# Patient Record
Sex: Male | Born: 1945 | Race: White | Hispanic: No | Marital: Married | Smoking: Never smoker
Health system: Southern US, Community
[De-identification: ages and names within clinical notes are randomized; demographics above are authoritative.]

## PROBLEM LIST (undated history)

## (undated) DIAGNOSIS — E785 Hyperlipidemia, unspecified: Secondary | ICD-10-CM

## (undated) HISTORY — DX: Hyperlipidemia, unspecified: E78.5

---

## 1998-08-17 ENCOUNTER — Emergency Department (HOSPITAL_COMMUNITY): Admission: EM | Admit: 1998-08-17 | Discharge: 1998-08-17 | Payer: Self-pay | Admitting: Emergency Medicine

## 2004-06-06 ENCOUNTER — Ambulatory Visit (HOSPITAL_COMMUNITY): Admission: RE | Admit: 2004-06-06 | Discharge: 2004-06-06 | Payer: Self-pay | Admitting: Gastroenterology

## 2004-06-06 ENCOUNTER — Encounter (INDEPENDENT_AMBULATORY_CARE_PROVIDER_SITE_OTHER): Payer: Self-pay | Admitting: Specialist

## 2009-07-09 ENCOUNTER — Encounter: Admission: RE | Admit: 2009-07-09 | Discharge: 2009-07-09 | Payer: Self-pay | Admitting: Sports Medicine

## 2009-07-23 ENCOUNTER — Encounter: Admission: RE | Admit: 2009-07-23 | Discharge: 2009-07-23 | Payer: Self-pay | Admitting: Sports Medicine

## 2009-08-22 ENCOUNTER — Encounter: Admission: RE | Admit: 2009-08-22 | Discharge: 2009-08-22 | Payer: Self-pay | Admitting: Sports Medicine

## 2010-02-14 ENCOUNTER — Encounter: Admission: RE | Admit: 2010-02-14 | Discharge: 2010-02-14 | Payer: Self-pay | Admitting: Family Medicine

## 2011-01-16 ENCOUNTER — Other Ambulatory Visit: Payer: Self-pay | Admitting: Family Medicine

## 2011-03-21 ENCOUNTER — Other Ambulatory Visit: Payer: Self-pay | Admitting: Dermatology

## 2011-04-03 ENCOUNTER — Other Ambulatory Visit: Payer: Self-pay | Admitting: Dermatology

## 2011-04-25 NOTE — Op Note (Signed)
NAME:  Russell Cruz, Russell Cruz NO.:  [id number]   MEDICAL RECORD NO.:  [id number]                   PATIENT TYPE:  AMB   LOCATION:  ENDO                                 FACILITY:  [facility]   PHYSICIAN:  Graylin Shiver, M.D.                DATE OF BIRTH:  [birth year]   DATE OF PROCEDURE:  06/06/2004  DATE OF DISCHARGE:                                 OPERATIVE REPORT   PROCEDURE:  Colonoscopy with biopsy.   INDICATION:  Screening.   Informed consent was obtained after explanation of the risks of bleeding,  infection and perforation.   PREMEDICATION:  Fentanyl 87.5 mcg IV, Versed 7 mg IV.   DESCRIPTION OF PROCEDURE:  With the patient in the left lateral decubitus  position rectal exam was performed and no masses were felt.  The Olympus  colonoscope was inserted into the rectum and advanced around the colon to  the cecum.  Cecal landmarks were identified.  The cecum and ascending colon  were normal.  The transverse colon normal.  The descending colon and sigmoid  were normal.  In the rectum there was a small 3-mm polyp biopsied off with  cold forceps.  He tolerated the procedure well without complications.   IMPRESSION:  Small rectal polyp, diagnosis code 211.4.   PLAN:  The pathology will be checked.                                               Graylin Shiver, M.D.    Germain Osgood  D:  06/06/2004  T:  06/06/2004  Job:  [id number]   cc:   Bryan Lemma. Manus Gunning, M.D.  [address] [place]  [place] [address]  Fax: [phone]

## 2012-07-22 ENCOUNTER — Other Ambulatory Visit: Payer: Self-pay | Admitting: Family Medicine

## 2012-07-22 ENCOUNTER — Ambulatory Visit
Admission: RE | Admit: 2012-07-22 | Discharge: 2012-07-22 | Disposition: A | Discharge status: Home / Self Care | Payer: BC Managed Care – PPO | Source: Ambulatory Visit | Attending: Family Medicine | Admitting: Family Medicine

## 2012-07-22 DIAGNOSIS — R05 Cough: Secondary | ICD-10-CM

## 2014-05-02 ENCOUNTER — Ambulatory Visit (INDEPENDENT_AMBULATORY_CARE_PROVIDER_SITE_OTHER): Payer: BC Managed Care – PPO | Admitting: Podiatry

## 2014-05-02 ENCOUNTER — Encounter: Payer: Self-pay | Admitting: Podiatry

## 2014-05-02 ENCOUNTER — Ambulatory Visit (INDEPENDENT_AMBULATORY_CARE_PROVIDER_SITE_OTHER): Payer: BC Managed Care – PPO

## 2014-05-02 VITALS — BP 148/82 | HR 60 | Resp 16 | Ht 72.0 in | Wt 225.0 lb

## 2014-05-02 DIAGNOSIS — M722 Plantar fascial fibromatosis: Secondary | ICD-10-CM

## 2014-05-02 MED ORDER — METHYLPREDNISOLONE (PAK) 4 MG PO TABS: ORAL_TABLET | ORAL | Status: DC

## 2014-05-02 MED ORDER — MELOXICAM 15 MG PO TABS: 15.0000 mg | ORAL_TABLET | Freq: Every day | ORAL | Status: DC

## 2014-05-02 NOTE — Progress Notes (Signed)
   Subjective:    Patient ID: Russell Cruz, male    DOB: 07-01-1946, 68 y.o.   MRN: [id number]  HPI Comments: "My right heel hurts"  Patient c/o aching plantar heel right for 3-4 months. He does have AM pain. Noticed that his calf felt tight the other day. Worse the more walking he does. He wears OTC inserts, padding at the heel, change in shoes-no better.     Review of Systems  All other systems reviewed and are negative.      Objective:   Physical Exam: I have reviewed his past medical history medications allergies surgeries social history and review of systems. Pulses are palpable bilateral. Neurologic sensorium is intact per Semmes-Weinstein monofilament. Deep tendon reflexes are intact bilateral. Muscle strength is 5 over 5 dorsiflexors plantar flexors inverters everters all intrinsic musculature is intact. Orthopedic evaluation demonstrates pain on palpation medial calcaneal tubercle of the right heel. Radiographic evaluation demonstrates a soft tissue increase in density at the plantar fascial calcaneal insertion site. Cutaneous evaluation demonstrates supple well hydrated cutis no erythema edema saline is drainage or odor noted.        Assessment & Plan:  Assessment: Plantar fasciitis right heel. Chronic.  Plan: Injected the right heel today and put him in a plantar fascial brace as well as a night splint. Wrote her prescription for Medrol Dosepak to be followed by medic. We discussed appropriate shoe gear stretching exercises ice therapy and shoe gear modifications. I will followup with him in one month.

## 2014-05-02 NOTE — Patient Instructions (Signed)

## 2016-01-11 ENCOUNTER — Other Ambulatory Visit: Payer: Self-pay | Admitting: Surgery

## 2016-08-21 ENCOUNTER — Ambulatory Visit
Admission: RE | Admit: 2016-08-21 | Discharge: 2016-08-21 | Disposition: A | Discharge status: Home / Self Care | Payer: BLUE CROSS/BLUE SHIELD | Source: Ambulatory Visit | Attending: Family Medicine | Admitting: Family Medicine

## 2016-08-21 ENCOUNTER — Other Ambulatory Visit: Payer: Self-pay | Admitting: Family Medicine

## 2016-08-21 DIAGNOSIS — Z01818 Encounter for other preprocedural examination: Secondary | ICD-10-CM

## 2017-09-21 ENCOUNTER — Other Ambulatory Visit: Payer: Self-pay | Admitting: Family Medicine

## 2018-04-01 IMAGING — CR DG CHEST 2V
2 series · 2 of 2 positions shown · non-contrast
Comparison: Radiographs July 22, 2012.

CLINICAL DATA: Preop for left shoulder surgery.

EXAM:
CHEST  2 VIEW

[w chest pa]
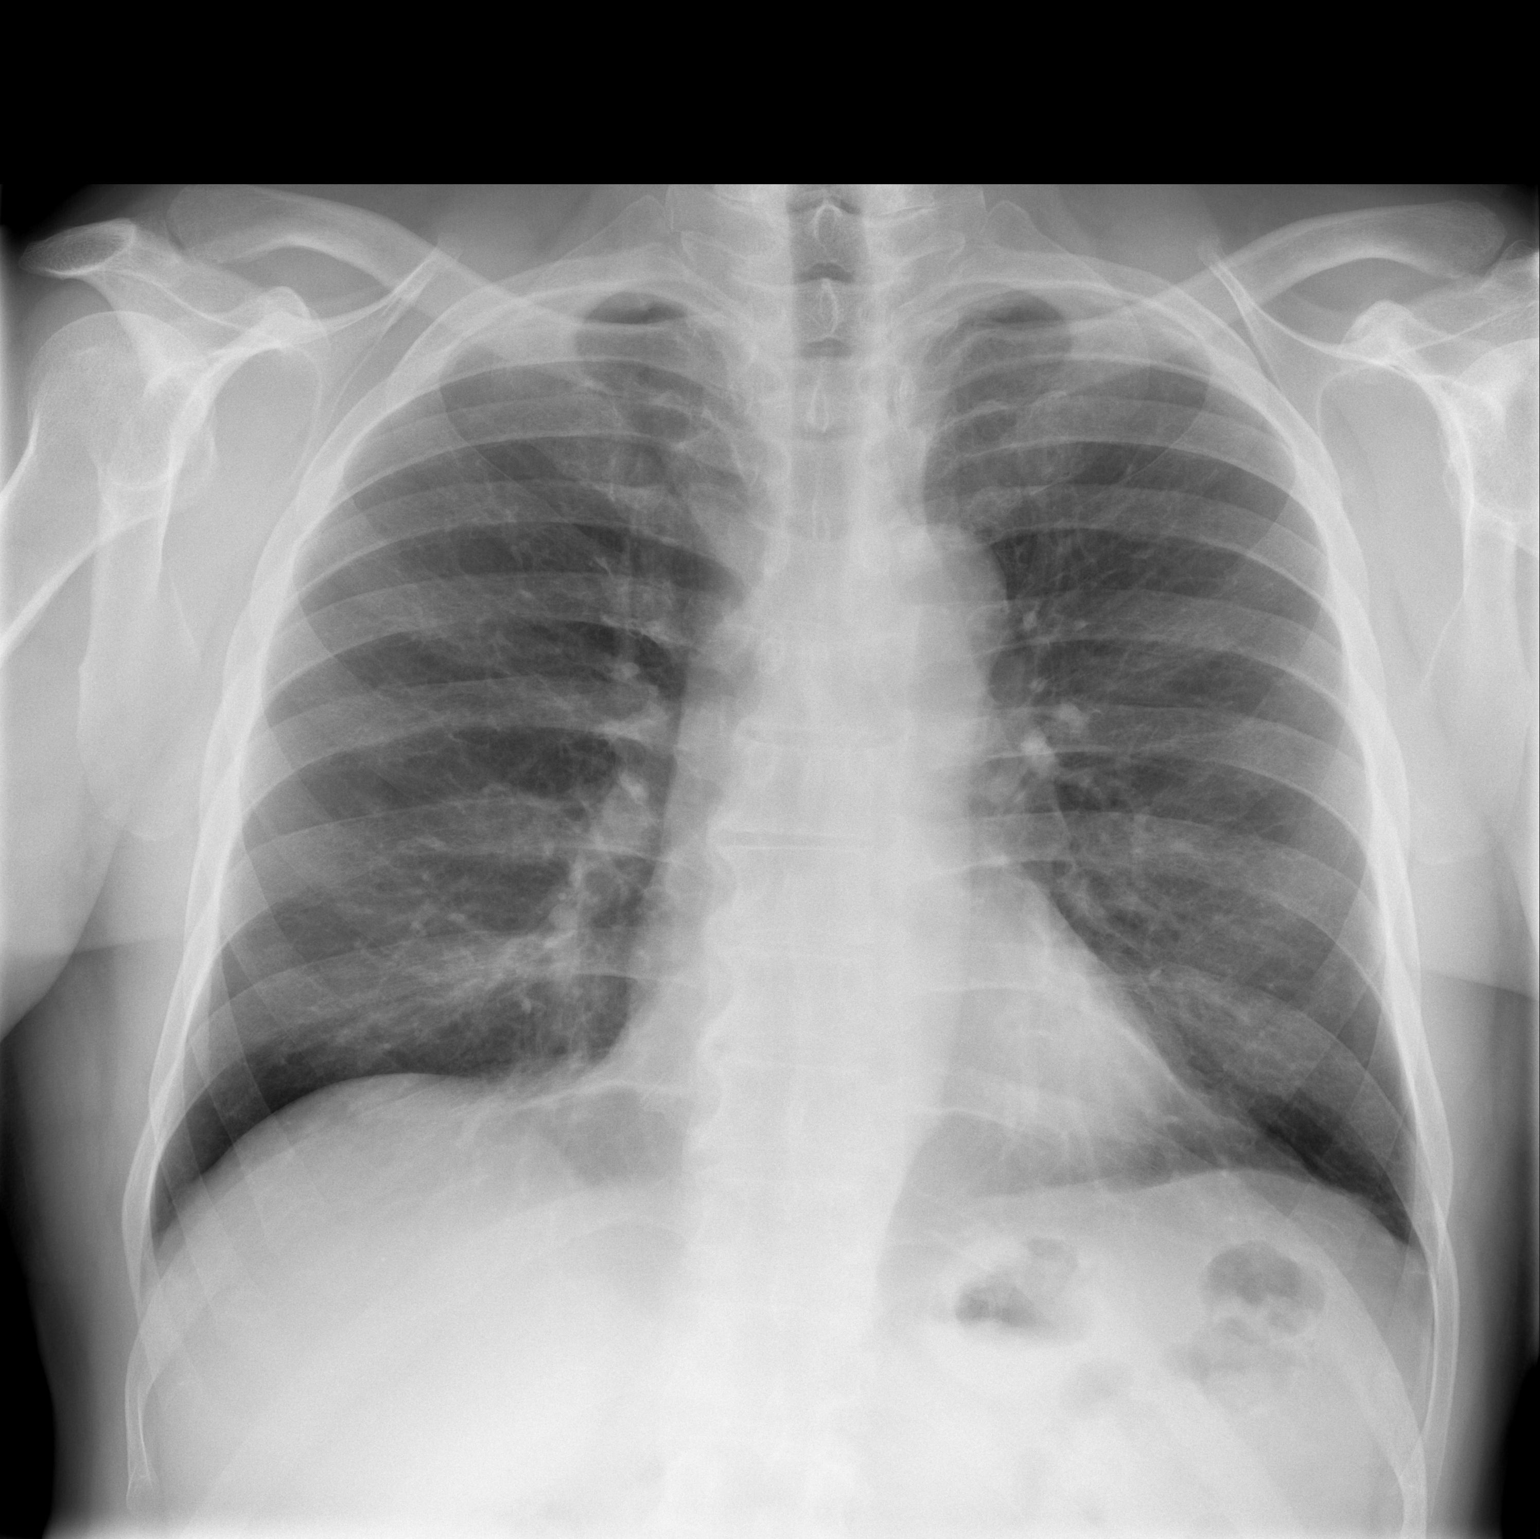

[w chest lat]
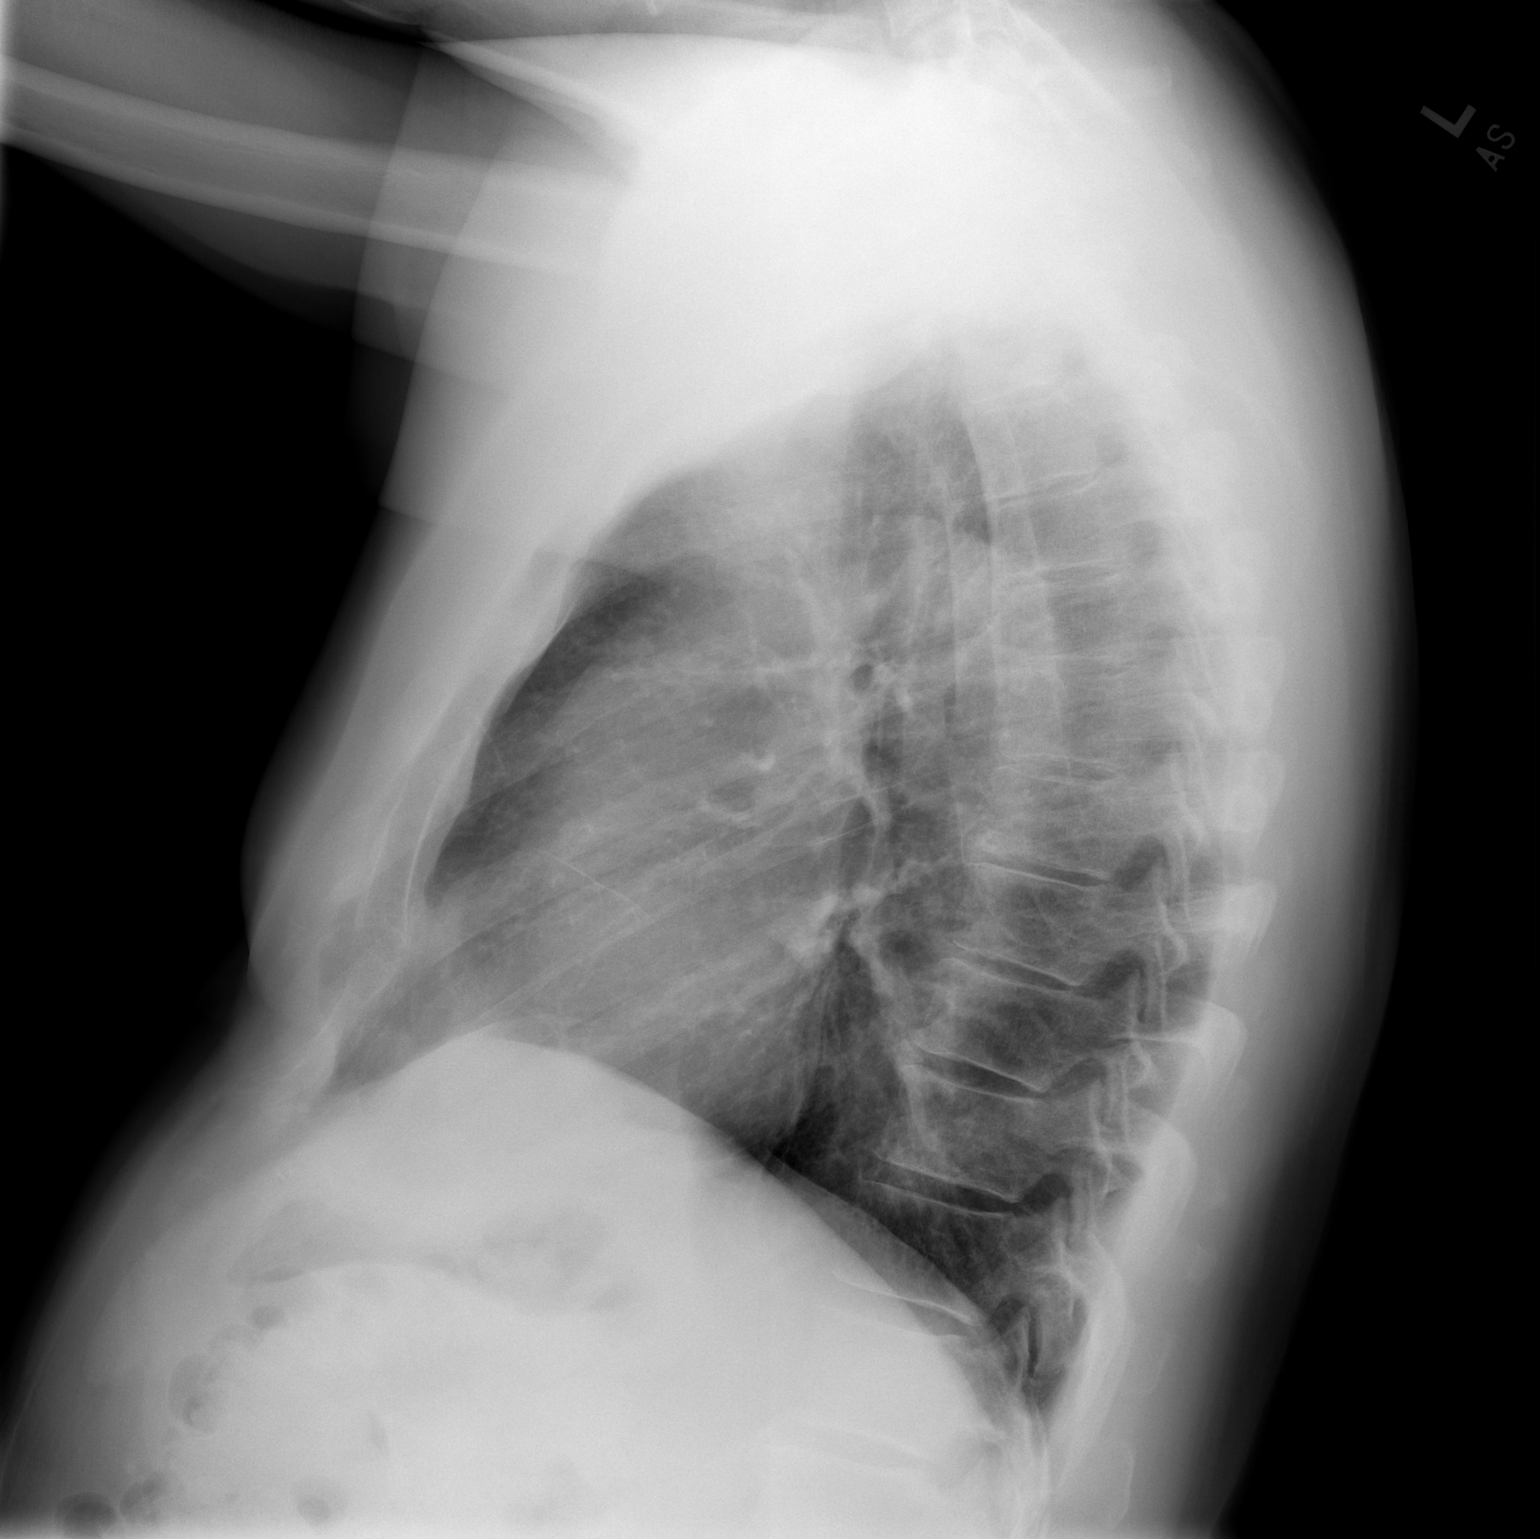

[2 of 2 positions shown; findings below may reference images not displayed]

FINDINGS: The heart size and mediastinal contours are within normal limits.
Both lungs are clear. The visualized skeletal structures are
unremarkable.
IMPRESSION: No active cardiopulmonary disease.

## 2018-09-27 ENCOUNTER — Encounter (HOSPITAL_COMMUNITY): Payer: Self-pay | Admitting: Emergency Medicine

## 2018-09-27 ENCOUNTER — Ambulatory Visit (HOSPITAL_COMMUNITY)
Admission: EM | Admit: 2018-09-27 | Discharge: 2018-09-27 | Disposition: A | Discharge status: Home / Self Care | Payer: BLUE CROSS/BLUE SHIELD | Attending: Family Medicine | Admitting: Family Medicine

## 2018-09-27 DIAGNOSIS — S61210A Laceration without foreign body of right index finger without damage to nail, initial encounter: Secondary | ICD-10-CM

## 2018-09-27 MED ORDER — LIDOCAINE HCL 2 % IJ SOLN
INTRAMUSCULAR | Status: AC
  Filled 2018-09-27: qty 20

## 2018-09-27 MED ORDER — POVIDONE-IODINE 10 % EX SOLN
CUTANEOUS | Status: AC
  Filled 2018-09-27: qty 118

## 2018-09-27 NOTE — Discharge Instructions (Signed)
Sutures out in 14 days Keep clean and reasonably dry Watch for infection

## 2018-09-27 NOTE — ED Provider Notes (Signed)
[place]    CSN: [id number] Arrival date & time: 09/27/18  1802     History   Chief Complaint Chief Complaint  Patient presents with  . Laceration    HPI Russell Cruz is a 72 y.o. male.   HPI  Cut finger with pocket knife Bleeding controlled with pressure Last tetanus 06-2011 Normal movement and feeling  Past Medical History:  Diagnosis Date  . Hyperlipidemia     There are no active problems to display for this patient.   History reviewed. No pertinent surgical history.     Home Medications    Prior to Admission medications   Medication Sig Start Date End Date Taking? Authorizing Provider  CALCIUM CITRATE PO Take by mouth.    [provider]  co-enzyme Q-10 30 MG capsule Take 30 mg by mouth 3 (three) times daily.    [provider]  Cyanocobalamin (B-12 PO) Take by mouth.    [provider]  glucosamine-chondroitin 500-400 MG tablet Take 1 tablet by mouth 2 (two) times daily.    [provider]  meloxicam (MOBIC) 15 MG tablet Take 1 tablet (15 mg total) by mouth daily. 05/02/14   Hyatt, Max T, DPM  Misc Natural Products (NF FORMULAS TESTOSTERONE PO) Take by mouth.    [provider]  Multiple Vitamin (MULTIVITAMIN WITH MINERALS) TABS tablet Take 1 tablet by mouth daily.    [provider]  Omega-3 Fatty Acids (FISH OIL PO) Take by mouth.    [provider]  rosuvastatin (CRESTOR) 20 MG tablet Take 20 mg by mouth daily.    [provider]    Family History No family history on file. Patient says no cancer.  Hypertension   Son died of massive MI at age 65 Social History Social History   Tobacco Use  . Smoking status: Never Smoker  Substance Use Topics  . Alcohol use: Yes  . Drug use: Not on file     Allergies   Patient has no known allergies.   Review of Systems Review of Systems  Constitutional: Negative for chills and fever.  HENT: Negative for ear pain and  sore throat.   Eyes: Negative for pain and visual disturbance.  Respiratory: Negative for cough and shortness of breath.   Cardiovascular: Negative for chest pain and palpitations.  Gastrointestinal: Negative for abdominal pain and vomiting.  Genitourinary: Negative for dysuria and hematuria.  Musculoskeletal: Negative for arthralgias and back pain.  Skin: Positive for wound. Negative for color change and rash.  Neurological: Negative for seizures and syncope.  All other systems reviewed and are negative.    Physical Exam Triage Vital Signs ED Triage Vitals  Enc Vitals Group     BP 09/27/18 1820 (!) 147/86     Pulse Rate 09/27/18 1820 77     Resp 09/27/18 1820 18     Temp 09/27/18 1820 98.1 F (36.7 C)     Temp src --      SpO2 09/27/18 1820 94 %     Weight --      Height --      Head Circumference --      Peak Flow --      Pain Score 09/27/18 1821 2     Pain Loc --      Pain Edu? --      Excl. in [place]? --    No data found.  Updated Vital Signs BP (!) 147/86   Pulse 77  Temp 98.1 F (36.7 C)   Resp 18   SpO2 94%       Physical Exam  Constitutional: He appears well-developed and well-nourished. No distress.  HENT:  Head: Normocephalic and atraumatic.  Mouth/Throat: Oropharynx is clear and moist.  Eyes: Pupils are equal, round, and reactive to light. Conjunctivae are normal.  Neck: Normal range of motion.  Cardiovascular: Normal rate.  Pulmonary/Chest: Effort normal. No respiratory distress.  Abdominal: Soft. He exhibits no distension.  Musculoskeletal: Normal range of motion. He exhibits no edema.  Neurological: He is alert. No sensory deficit.  Skin: Skin is warm and dry.  Straight laceration on dorsum of finger right index from mid prox phalanx to DIP crease.  Psychiatric: He has a normal mood and affect. His behavior is normal.     UC Treatments / Results  Labs (all labs ordered are listed, but only abnormal results are displayed) Labs Reviewed - No  data to display  EKG None  Radiology No results found.  Procedures Laceration Repair Date/Time: 09/27/2018 7:38 PM Performed by: Raylene Everts, MD Authorized by: Raylene Everts, MD   Consent:    Consent obtained:  Verbal   Consent given by:  Patient and spouse   Risks discussed:  Infection, need for additional repair, tendon damage, vascular damage and nerve damage   Alternatives discussed:  No treatment and referral Anesthesia (see MAR for exact dosages):    Anesthesia method:  Nerve block   Block location:  Index finger r   Block needle gauge:  27 G   Block anesthetic:  Lidocaine 2% w/o epi   Block injection procedure:  Anatomic landmarks identified, introduced needle and negative aspiration for blood   Block outcome:  Anesthesia achieved Laceration details:    Location:  Finger   Finger location:  R index finger   Length (cm):  7   Depth (mm):  5 Repair type:    Repair type:  Simple Pre-procedure details:    Preparation:  Patient was prepped and draped in usual sterile fashion Exploration:    Hemostasis achieved with:  Direct pressure   Wound exploration: wound explored through full range of motion     Wound extent: no foreign bodies/material noted, no muscle damage noted, no nerve damage noted, no tendon damage noted and no vascular damage noted     Contaminated: no   Treatment:    Area cleansed with:  Betadine   Amount of cleaning:  Extensive   Irrigation solution:  Sterile saline   Irrigation volume:  20 cc   Irrigation method:  Syringe Skin repair:    Repair method:  Sutures   Suture size:  4-0   Suture material:  Prolene   Suture technique:  Simple interrupted   Number of sutures:  7 Approximation:    Approximation:  Close Post-procedure details:    Dressing:  Non-adherent dressing and splint for protection   Patient tolerance of procedure:  Tolerated well, no immediate complications   (including critical care time)  Medications Ordered in  UC Medications - No data to display  Initial Impression / Assessment and Plan / UC Course  I have reviewed the triage vital signs and the nursing notes.  Pertinent labs & imaging results that were available during my care of the patient were reviewed by me and considered in my medical decision making (see chart for details).     Discussed cleaning.  Wound care.  Splint.  Activity limits.  infection. Final Clinical Impressions(s) /  UC Diagnoses   Final diagnoses:  Laceration of right index finger without foreign body without damage to nail, initial encounter     Discharge Instructions     Sutures out in 14 days Keep clean and reasonably dry Watch for infection   ED Prescriptions    None     Controlled Substance Prescriptions [place] Controlled Substance Registry consulted? Not Applicable   Raylene Everts, MD 09/27/18 [phone]

## 2018-09-27 NOTE — ED Triage Notes (Signed)
Pt states he was trying to poke a hole in his belt and closed his R index finger in the pocket knife. Large laceration noted to R index finger. Bleeding controlled.

## 2018-11-02 ENCOUNTER — Ambulatory Visit (HOSPITAL_COMMUNITY)
Admission: RE | Admit: 2018-11-02 | Discharge: 2018-11-02 | Disposition: A | Discharge status: Home / Self Care | Payer: BLUE CROSS/BLUE SHIELD | Source: Ambulatory Visit | Attending: Orthopedic Surgery | Admitting: Orthopedic Surgery

## 2018-11-02 ENCOUNTER — Other Ambulatory Visit (HOSPITAL_COMMUNITY): Payer: Self-pay | Admitting: Orthopedic Surgery

## 2018-11-02 DIAGNOSIS — M79604 Pain in right leg: Secondary | ICD-10-CM | POA: Insufficient documentation

## 2018-11-02 DIAGNOSIS — M7989 Other specified soft tissue disorders: Principal | ICD-10-CM

## 2018-11-02 NOTE — Progress Notes (Signed)
*  Preliminary Results* Right lower extremity venous duplex completed. 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 No other DVT noted. There is no evidence of right Baker's cyst.  Attempted to call report, left message on [phone] and will let patient go home.  11/02/2018 3:52 PM  Maudry Mayhew, MHA, RVT, RDCS, RDMS

## 2018-11-03 ENCOUNTER — Ambulatory Visit (HOSPITAL_COMMUNITY): Payer: BLUE CROSS/BLUE SHIELD

## 2020-05-18 ENCOUNTER — Other Ambulatory Visit: Payer: Self-pay | Admitting: Family Medicine

## 2020-05-18 DIAGNOSIS — C44622 Squamous cell carcinoma of skin of right upper limb, including shoulder: Secondary | ICD-10-CM | POA: Diagnosis not present

## 2020-05-18 DIAGNOSIS — Z79899 Other long term (current) drug therapy: Secondary | ICD-10-CM | POA: Diagnosis not present

## 2020-05-18 DIAGNOSIS — D485 Neoplasm of uncertain behavior of skin: Secondary | ICD-10-CM | POA: Diagnosis not present

## 2020-05-18 DIAGNOSIS — E7800 Pure hypercholesterolemia, unspecified: Secondary | ICD-10-CM | POA: Diagnosis not present

## 2020-06-08 DIAGNOSIS — M25512 Pain in left shoulder: Secondary | ICD-10-CM | POA: Diagnosis not present

## 2020-06-14 DIAGNOSIS — Z85828 Personal history of other malignant neoplasm of skin: Secondary | ICD-10-CM | POA: Diagnosis not present

## 2020-06-14 DIAGNOSIS — L821 Other seborrheic keratosis: Secondary | ICD-10-CM | POA: Diagnosis not present

## 2020-06-14 DIAGNOSIS — L820 Inflamed seborrheic keratosis: Secondary | ICD-10-CM | POA: Diagnosis not present

## 2020-06-14 DIAGNOSIS — I8391 Asymptomatic varicose veins of right lower extremity: Secondary | ICD-10-CM | POA: Diagnosis not present

## 2020-08-06 DIAGNOSIS — H6091 Unspecified otitis externa, right ear: Secondary | ICD-10-CM | POA: Diagnosis not present

## 2020-10-30 DIAGNOSIS — Z86010 Personal history of colonic polyps: Secondary | ICD-10-CM | POA: Diagnosis not present

## 2020-10-30 DIAGNOSIS — R7303 Prediabetes: Secondary | ICD-10-CM | POA: Diagnosis not present

## 2020-10-30 DIAGNOSIS — Z0000 Encounter for general adult medical examination without abnormal findings: Secondary | ICD-10-CM | POA: Diagnosis not present

## 2020-10-30 DIAGNOSIS — E7800 Pure hypercholesterolemia, unspecified: Secondary | ICD-10-CM | POA: Diagnosis not present

## 2020-10-30 DIAGNOSIS — Z79899 Other long term (current) drug therapy: Secondary | ICD-10-CM | POA: Diagnosis not present

## 2020-12-31 DIAGNOSIS — L905 Scar conditions and fibrosis of skin: Secondary | ICD-10-CM | POA: Diagnosis not present

## 2020-12-31 DIAGNOSIS — L821 Other seborrheic keratosis: Secondary | ICD-10-CM | POA: Diagnosis not present

## 2020-12-31 DIAGNOSIS — L814 Other melanin hyperpigmentation: Secondary | ICD-10-CM | POA: Diagnosis not present

## 2020-12-31 DIAGNOSIS — Z85828 Personal history of other malignant neoplasm of skin: Secondary | ICD-10-CM | POA: Diagnosis not present

## 2020-12-31 DIAGNOSIS — L570 Actinic keratosis: Secondary | ICD-10-CM | POA: Diagnosis not present

## 2021-01-16 DIAGNOSIS — Z1159 Encounter for screening for other viral diseases: Secondary | ICD-10-CM | POA: Diagnosis not present

## 2021-01-16 DIAGNOSIS — Z86010 Personal history of colonic polyps: Secondary | ICD-10-CM | POA: Diagnosis not present

## 2021-01-16 DIAGNOSIS — K5900 Constipation, unspecified: Secondary | ICD-10-CM | POA: Diagnosis not present

## 2021-01-31 DIAGNOSIS — Z79899 Other long term (current) drug therapy: Secondary | ICD-10-CM | POA: Diagnosis not present

## 2021-01-31 DIAGNOSIS — E7800 Pure hypercholesterolemia, unspecified: Secondary | ICD-10-CM | POA: Diagnosis not present

## 2021-02-27 DIAGNOSIS — C44629 Squamous cell carcinoma of skin of left upper limb, including shoulder: Secondary | ICD-10-CM | POA: Diagnosis not present

## 2021-02-27 DIAGNOSIS — D485 Neoplasm of uncertain behavior of skin: Secondary | ICD-10-CM | POA: Diagnosis not present

## 2021-03-13 DIAGNOSIS — D485 Neoplasm of uncertain behavior of skin: Secondary | ICD-10-CM | POA: Diagnosis not present

## 2021-03-13 DIAGNOSIS — C44629 Squamous cell carcinoma of skin of left upper limb, including shoulder: Secondary | ICD-10-CM | POA: Diagnosis not present

## 2021-03-13 DIAGNOSIS — Z85828 Personal history of other malignant neoplasm of skin: Secondary | ICD-10-CM | POA: Diagnosis not present

## 2021-04-02 DIAGNOSIS — K5730 Diverticulosis of large intestine without perforation or abscess without bleeding: Secondary | ICD-10-CM | POA: Diagnosis not present

## 2021-04-02 DIAGNOSIS — D124 Benign neoplasm of descending colon: Secondary | ICD-10-CM | POA: Diagnosis not present

## 2021-04-02 DIAGNOSIS — Z86010 Personal history of colonic polyps: Secondary | ICD-10-CM | POA: Diagnosis not present

## 2021-04-05 DIAGNOSIS — D124 Benign neoplasm of descending colon: Secondary | ICD-10-CM | POA: Diagnosis not present

## 2021-05-08 DIAGNOSIS — E7800 Pure hypercholesterolemia, unspecified: Secondary | ICD-10-CM | POA: Diagnosis not present

## 2021-11-12 DIAGNOSIS — Z79899 Other long term (current) drug therapy: Secondary | ICD-10-CM | POA: Diagnosis not present

## 2021-11-12 DIAGNOSIS — D692 Other nonthrombocytopenic purpura: Secondary | ICD-10-CM | POA: Diagnosis not present

## 2021-11-12 DIAGNOSIS — R7303 Prediabetes: Secondary | ICD-10-CM | POA: Diagnosis not present

## 2021-11-12 DIAGNOSIS — Z0000 Encounter for general adult medical examination without abnormal findings: Secondary | ICD-10-CM | POA: Diagnosis not present

## 2021-11-12 DIAGNOSIS — E7800 Pure hypercholesterolemia, unspecified: Secondary | ICD-10-CM | POA: Diagnosis not present

## 2021-12-06 DIAGNOSIS — J019 Acute sinusitis, unspecified: Secondary | ICD-10-CM | POA: Diagnosis not present

## 2022-01-01 DIAGNOSIS — R059 Cough, unspecified: Secondary | ICD-10-CM | POA: Diagnosis not present

## 2022-01-01 DIAGNOSIS — J988 Other specified respiratory disorders: Secondary | ICD-10-CM | POA: Diagnosis not present

## 2022-01-01 DIAGNOSIS — J069 Acute upper respiratory infection, unspecified: Secondary | ICD-10-CM | POA: Diagnosis not present

## 2022-01-02 DIAGNOSIS — D0439 Carcinoma in situ of skin of other parts of face: Secondary | ICD-10-CM | POA: Diagnosis not present

## 2022-01-02 DIAGNOSIS — D1801 Hemangioma of skin and subcutaneous tissue: Secondary | ICD-10-CM | POA: Diagnosis not present

## 2022-01-02 DIAGNOSIS — Z85828 Personal history of other malignant neoplasm of skin: Secondary | ICD-10-CM | POA: Diagnosis not present

## 2022-01-02 DIAGNOSIS — L814 Other melanin hyperpigmentation: Secondary | ICD-10-CM | POA: Diagnosis not present

## 2022-01-02 DIAGNOSIS — D485 Neoplasm of uncertain behavior of skin: Secondary | ICD-10-CM | POA: Diagnosis not present

## 2022-01-02 DIAGNOSIS — L570 Actinic keratosis: Secondary | ICD-10-CM | POA: Diagnosis not present

## 2022-01-02 DIAGNOSIS — L821 Other seborrheic keratosis: Secondary | ICD-10-CM | POA: Diagnosis not present

## 2022-04-07 DIAGNOSIS — L578 Other skin changes due to chronic exposure to nonionizing radiation: Secondary | ICD-10-CM | POA: Diagnosis not present

## 2022-04-07 DIAGNOSIS — D0439 Carcinoma in situ of skin of other parts of face: Secondary | ICD-10-CM | POA: Diagnosis not present

## 2022-04-07 DIAGNOSIS — Z85828 Personal history of other malignant neoplasm of skin: Secondary | ICD-10-CM | POA: Diagnosis not present

## 2022-04-14 DIAGNOSIS — M546 Pain in thoracic spine: Secondary | ICD-10-CM | POA: Diagnosis not present

## 2022-05-19 DIAGNOSIS — H52223 Regular astigmatism, bilateral: Secondary | ICD-10-CM | POA: Diagnosis not present

## 2022-05-19 DIAGNOSIS — Z135 Encounter for screening for eye and ear disorders: Secondary | ICD-10-CM | POA: Diagnosis not present

## 2022-05-19 DIAGNOSIS — H524 Presbyopia: Secondary | ICD-10-CM | POA: Diagnosis not present

## 2022-05-19 DIAGNOSIS — H5213 Myopia, bilateral: Secondary | ICD-10-CM | POA: Diagnosis not present

## 2022-05-19 DIAGNOSIS — H2513 Age-related nuclear cataract, bilateral: Secondary | ICD-10-CM | POA: Diagnosis not present

## 2022-07-31 DIAGNOSIS — H18413 Arcus senilis, bilateral: Secondary | ICD-10-CM | POA: Diagnosis not present

## 2022-07-31 DIAGNOSIS — H35371 Puckering of macula, right eye: Secondary | ICD-10-CM | POA: Diagnosis not present

## 2022-07-31 DIAGNOSIS — H2513 Age-related nuclear cataract, bilateral: Secondary | ICD-10-CM | POA: Diagnosis not present

## 2022-07-31 DIAGNOSIS — H25043 Posterior subcapsular polar age-related cataract, bilateral: Secondary | ICD-10-CM | POA: Diagnosis not present

## 2022-07-31 DIAGNOSIS — H25013 Cortical age-related cataract, bilateral: Secondary | ICD-10-CM | POA: Diagnosis not present

## 2022-07-31 DIAGNOSIS — H2511 Age-related nuclear cataract, right eye: Secondary | ICD-10-CM | POA: Diagnosis not present

## 2022-09-03 DIAGNOSIS — M25562 Pain in left knee: Secondary | ICD-10-CM | POA: Diagnosis not present

## 2022-09-15 ENCOUNTER — Encounter: Payer: Self-pay | Admitting: Internal Medicine

## 2022-09-15 ENCOUNTER — Ambulatory Visit: Payer: PPO | Attending: Internal Medicine | Admitting: Internal Medicine

## 2022-09-15 VITALS — BP 140/76 | HR 77 | Ht 72.0 in | Wt 220.2 lb

## 2022-09-15 DIAGNOSIS — Z8249 Family history of ischemic heart disease and other diseases of the circulatory system: Secondary | ICD-10-CM

## 2022-09-15 DIAGNOSIS — E785 Hyperlipidemia, unspecified: Secondary | ICD-10-CM

## 2022-09-15 DIAGNOSIS — R011 Cardiac murmur, unspecified: Secondary | ICD-10-CM | POA: Diagnosis not present

## 2022-09-15 NOTE — Patient Instructions (Addendum)
Medication Instructions:  Your Physician recommend you continue on your current medication as directed.    *If you need a refill on your cardiac medications before your next appointment, please call your pharmacy*   Lab Work: Your physician recommends lab work today ([place], [place])  If you have labs (blood work) drawn today and your tests are completely normal, you will receive your results only by: MyChart Message (if you have MyChart) OR A paper copy in the mail If you have any lab test that is abnormal or we need to change your treatment, we will call you to review the results.   Testing/Procedures: Your physician has requested that you have an echocardiogram. Echocardiography is a painless test that uses sound waves to create images of your heart. It provides your doctor with information about the size and shape of your heart and how well your heart's chambers and valves are working. This procedure takes approximately one hour. There are no restrictions for this procedure. [place]  CT coronary calcium score.   Test locations:  [place]   This is $99 out of pocket.   Coronary CalciumScan A coronary calcium scan is an imaging test used to look for deposits of calcium and other fatty materials (plaques) in the inner lining of the blood vessels of the heart (coronary arteries). These deposits of calcium and plaques can partly clog and narrow the coronary arteries without producing any symptoms or warning signs. This puts a person at risk for a heart attack. This test can detect these deposits before symptoms develop. Tell a health care provider about: Any allergies you have. All medicines you are taking, including vitamins, herbs, eye drops, creams, and over-the-counter medicines. Any problems you or family members have had with anesthetic medicines. Any blood disorders you have. Any surgeries you have had. Any medical conditions you have. Whether  you are pregnant or may be pregnant. What are the risks? Generally, this is a safe procedure. However, problems may occur, including: Harm to a pregnant woman and her unborn baby. This test involves the use of radiation. Radiation exposure can be dangerous to a pregnant woman and her unborn baby. If you are pregnant, you generally should not have this procedure done. Slight increase in the risk of cancer. This is because of the radiation involved in the test. What happens before the procedure? No preparation is needed for this procedure. What happens during the procedure? You will undress and remove any jewelry around your neck or chest. You will put on a hospital gown. Sticky electrodes will be placed on your chest. The electrodes will be connected to an electrocardiogram (ECG) machine to record a tracing of the electrical activity of your heart. A CT scanner will take pictures of your heart. During this time, you will be asked to lie still and hold your breath for 2-3 seconds while a picture of your heart is being taken. The procedure may vary among health care providers and hospitals. What happens after the procedure? You can get dressed. You can return to your normal activities. It is up to you to get the results of your test. Ask your health care provider, or the department that is doing the test, when your results will be ready. Summary A coronary calcium scan is an imaging test used to look for deposits of calcium and other fatty materials (plaques) in the inner lining of the blood vessels of the heart (coronary arteries). Generally, this is a safe procedure.  Tell your health care provider if you are pregnant or may be pregnant. No preparation is needed for this procedure. A CT scanner will take pictures of your heart. You can return to your normal activities after the scan is done. This information is not intended to replace advice given to you by your health care provider. Make sure  you discuss any questions you have with your health care provider. Document Released: 05/22/2008 Document Revised: 10/13/2016 Document Reviewed: 10/13/2016 Elsevier Interactive Patient Education  2017 [place]: At [facility], you and your health needs are our priority.  As part of our continuing mission to provide you with exceptional heart care, we have created designated Provider Care Teams.  These Care Teams include your primary Cardiologist (physician) and Advanced Practice Providers (APPs -  Physician Assistants and Nurse Practitioners) who all work together to provide you with the care you need, when you need it.  We recommend signing up for the patient portal called "MyChart".  Sign up information is provided on this After Visit Summary.  MyChart is used to connect with patients for Virtual Visits (Telemedicine).  Patients are able to view lab/test results, encounter notes, upcoming appointments, etc.  Non-urgent messages can be sent to your provider as well.   To learn more about what you can do with MyChart, go to [email or web address].    Your next appointment:   3-4 month(s)  The format for your next appointment:   In Person  Provider:   Pixie Casino, MD

## 2022-09-16 ENCOUNTER — Encounter: Payer: Self-pay | Admitting: Internal Medicine

## 2022-09-16 LAB — LIPOPROTEIN A (LPA): Lipoprotein (a): 40.7 nmol/L (ref <75.0)

## 2022-09-16 LAB — NMR, LIPOPROFILE
Cholesterol, Total: 250 mg/dL — ABNORMAL HIGH (ref 100–199)
HDL Particle Number: 39.4 umol/L (ref >=30.5)
HDL-C: 58 mg/dL (ref >39)
LDL Particle Number: 2294 nmol/L — ABNORMAL HIGH (ref <1000)
LDL Size: 20.8 nm (ref >20.5)
LDL-C (NIH Calc): 175 mg/dL — ABNORMAL HIGH (ref 0–99)
LP-IR Score: 74 — ABNORMAL HIGH (ref <=45)
Small LDL Particle Number: 975 nmol/L — ABNORMAL HIGH (ref <=527)
Triglycerides: 96 mg/dL (ref 0–149)

## 2022-09-16 NOTE — Progress Notes (Signed)
LIPID CLINIC CONSULT NOTE  Chief Complaint:  Manage dyslipidemia  Primary Care Physician: Gaynelle Arabian, MD  Primary Cardiologist:  Pixie Casino, MD  HPI:  Russell Cruz is a 76 y.o. male who is being seen today for the evaluation of dyslipidemia at the request of Gaynelle Arabian, MD. this is a pleasant 76 year old male kindly referred for evaluation and management of dyslipidemia.  He notes a family history of high cholesterol including in his father who had an MI and died at age 68.  He also has a son with high cholesterol.  Recent lipid showed total cholesterol 233, triglycerides 134, HDL 49 and LDL 159.  He has been on rosuvastatin 20 mg daily.  However, he had reported side effects on this including worsening muscle aches and was referred for alternative therapies.  The lab work referenced above however was from December 2022 and is not recent or reflective of him off of therapy.  PMHx:  Past Medical History:  Diagnosis Date   Hyperlipidemia     No past surgical history on file.  FAMHx:  Family History  Problem Relation Age of Onset   Hyperlipidemia Father    Heart attack Father     SOCHx:   reports that he has never smoked. He does not have any smokeless tobacco history on file. He reports current alcohol use. No history on file for drug use.  ALLERGIES:  No Known Allergies  ROS: Pertinent items noted in HPI and remainder of comprehensive ROS otherwise negative.  HOME MEDS: Current Outpatient Medications on File Prior to Visit  Medication Sig Dispense Refill   CALCIUM CITRATE PO Take by mouth.     co-enzyme Q-10 30 MG capsule Take 30 mg by mouth 3 (three) times daily.     Cyanocobalamin (B-12 PO) Take by mouth.     Misc Natural Products (NF FORMULAS TESTOSTERONE PO) Take by mouth.     Multiple Vitamin (MULTIVITAMIN WITH MINERALS) TABS tablet Take 1 tablet by mouth daily.     Omega-3 Fatty Acids (FISH OIL PO) Take by mouth.     glucosamine-chondroitin  500-400 MG tablet Take 1 tablet by mouth 2 (two) times daily. (Patient not taking: Reported on 09/15/2022)     meloxicam (MOBIC) 15 MG tablet Take 1 tablet (15 mg total) by mouth daily. (Patient not taking: Reported on 09/15/2022) 30 tablet 3   rosuvastatin (CRESTOR) 20 MG tablet Take 20 mg by mouth daily. (Patient not taking: Reported on 09/15/2022)     No current facility-administered medications on file prior to visit.    LABS/IMAGING: Results for orders placed or performed in visit on 09/15/22 (from the past 48 hour(s))  NMR, lipoprofile     Status: Abnormal   Collection Time: 09/15/22 10:57 AM  Result Value Ref Range   LDL Particle Number 2,[id number] (H) <1,000 nmol/L    Comment:                           Low                   < 1000                           Moderate         1000 - 1299  Borderline-High  1300 - 1599                           High             1600 - 2000                           Very High             > 2000    LDL-C (NIH Calc) 175 (H) 0 - 99 mg/dL    Comment:                           Optimal               <  100                           Above optimal     100 -  129                           Borderline        130 -  159                           High              160 -  189                           Very high             >  189    HDL-C 58 >39 mg/dL   Triglycerides 96 0 - 149 mg/dL   Cholesterol, Total 250 (H) 100 - 199 mg/dL   HDL Particle Number 39.4 >=30.5 umol/L   Small LDL Particle Number 975 (H) <=527 nmol/L   LDL Size 20.8 >20.5 nm    Comment:  ----------------------------------------------------------                  ** INTERPRETATIVE INFORMATION**                  PARTICLE CONCENTRATION AND SIZE                     <--Lower CVD Risk   Higher CVD Risk-->   LDL AND HDL PARTICLES   Percentile in Reference Population   HDL-P (total)        High     75th    50th    25th   Low                        >34.9    34.9    30.5    26.7    <26.7   Small LDL-P          Low      25th    50th    75th   High                        <117     117     527     839    >839   LDL Size   <-Large (Pattern A)->    <-  Small (Pattern B)->                     23.0    20.6           20.5      19.0  ---------------------------------------------------------- Small LDL-P and LDL Size are associated with CVD risk, but not after LDL-P is taken into account.    LP-IR Score 74 (H) <=45    Comment: INSULIN RESISTANCE MARKER     <--Insulin Sensitive    Insulin Resistant-->            Percentile in Reference Population Insulin Resistance Score LP-IR Score   Low   25th   50th   75th   High               <27   27     45     63     >63 LP-IR Score is inaccurate if patient is non-fasting. The LP-IR score is a laboratory developed index that has been associated with insulin resistance and diabetes risk and should be used as one component of a physician's clinical assessment.   Lipoprotein A (LPA)     Status: None   Collection Time: 09/15/22 10:57 AM  Result Value Ref Range   Lipoprotein (a) 40.7 <75.0 nmol/L    Comment: Note:  Values greater than or equal to 75.0 nmol/L may        indicate an independent risk factor for CHD,        but must be evaluated with caution when applied        to non-Caucasian populations due to the        influence of genetic factors on Lp(a) across        ethnicities.    No results found.  LIPID PANEL: No results found for: "CHOL", "TRIG", "HDL", "CHOLHDL", "VLDL", "LDLCALC", "LDLDIRECT"  WEIGHTS: Wt Readings from Last 3 Encounters:  09/15/22 220 lb 3.2 oz (99.9 kg)  05/02/14 225 lb (102.1 kg)    VITALS: BP (!) 140/76   Pulse 77   Ht 6' (1.829 m)   Wt 220 lb 3.2 oz (99.9 kg)   SpO2 98%   BMI 29.86 kg/m   EXAM: General appearance: alert and no distress Neck: no carotid bruit, no JVD, and thyroid not enlarged, symmetric, no tenderness/mass/nodules Lungs: clear to auscultation bilaterally Heart: regular  rate and rhythm, S1, S2 normal, and systolic murmur: early systolic 3/6, crescendo at 2nd right intercostal space Abdomen: soft, non-tender; bowel sounds normal; no masses,  no organomegaly Extremities: extremities normal, atraumatic, no cyanosis or edema Pulses: 2+ and symmetric Skin: Skin color, texture, turgor normal. No rashes or lesions Neurologic: Grossly normal  EKG: Deferred  ASSESSMENT: Mixed dyslipidemia Statin intolerance -myalgias  PLAN: 1.   Mr. Struckman has a mixed dyslipidemia and relative statin intolerance.  There is a strong family history of heart disease both in his father and high cholesterol to son.  With that being said, he has few traditional cardiac risk factors.  I would like to understand his risk further and will obtain a lipid NMR and LP(a).  We will also get a calcium score to further restratify him and consider alternative therapies based on that.  He is in agreement with this approach.  Also, he was noted to have murmur on exam today.  He said he was told he had a murmur when he was a child.  He has never had an echo  evaluation.  We will order an echocardiogram.  I will contact him with those results when they are available.  Thanks again for the kind referral.  Plan follow up with me in about 3 to 4 months at which time we will likely repeat his lipids.  Pixie Casino, MD, [facility], [place] Director of the Advanced Lipid Disorders &  Cardiovascular Risk Reduction Clinic Diplomate of the American Board of Clinical Lipidology Attending Cardiologist  Direct Dial: [phone]  Fax: [phone]  Website:  [email or web address]  Nadean Corwin Zoa Dowty 09/16/2022, 1:14 PM

## 2022-09-17 DIAGNOSIS — M25562 Pain in left knee: Secondary | ICD-10-CM | POA: Diagnosis not present

## 2022-09-22 DIAGNOSIS — Z85828 Personal history of other malignant neoplasm of skin: Secondary | ICD-10-CM | POA: Diagnosis not present

## 2022-09-22 DIAGNOSIS — C44629 Squamous cell carcinoma of skin of left upper limb, including shoulder: Secondary | ICD-10-CM | POA: Diagnosis not present

## 2022-09-22 DIAGNOSIS — L570 Actinic keratosis: Secondary | ICD-10-CM | POA: Diagnosis not present

## 2022-09-25 DIAGNOSIS — M25562 Pain in left knee: Secondary | ICD-10-CM | POA: Diagnosis not present

## 2022-09-29 DIAGNOSIS — M25562 Pain in left knee: Secondary | ICD-10-CM | POA: Diagnosis not present

## 2022-10-08 ENCOUNTER — Telehealth: Payer: Self-pay | Admitting: Internal Medicine

## 2022-10-08 ENCOUNTER — Other Ambulatory Visit: Payer: Self-pay

## 2022-10-08 MED ORDER — EZETIMIBE 10 MG PO TABS: 10.0000 mg | ORAL_TABLET | Freq: Every day | ORAL | 3 refills | Status: DC

## 2022-10-08 NOTE — Telephone Encounter (Signed)
Called patient, advised of lab results below:  I don't think he would qualify for PCKS9i at this point - would start with zetia 10 mg daily. If his calcium score is high, would pursue that ultimately.   -Mali   Zetia 10 mg sent to pharmacy.  Advised to keep calcium score on 11/22. Patient verbalized understanding, verified pharmacy.

## 2022-10-08 NOTE — Telephone Encounter (Signed)
Pt returning nurses call regarding lab results. Please advise 

## 2022-10-20 DIAGNOSIS — H52201 Unspecified astigmatism, right eye: Secondary | ICD-10-CM | POA: Diagnosis not present

## 2022-10-20 DIAGNOSIS — H2511 Age-related nuclear cataract, right eye: Secondary | ICD-10-CM | POA: Diagnosis not present

## 2022-10-21 DIAGNOSIS — H2512 Age-related nuclear cataract, left eye: Secondary | ICD-10-CM | POA: Diagnosis not present

## 2022-10-21 DIAGNOSIS — H25042 Posterior subcapsular polar age-related cataract, left eye: Secondary | ICD-10-CM | POA: Diagnosis not present

## 2022-10-21 DIAGNOSIS — H25012 Cortical age-related cataract, left eye: Secondary | ICD-10-CM | POA: Diagnosis not present

## 2022-10-29 ENCOUNTER — Ambulatory Visit (INDEPENDENT_AMBULATORY_CARE_PROVIDER_SITE_OTHER): Payer: PPO

## 2022-10-29 ENCOUNTER — Encounter (HOSPITAL_BASED_OUTPATIENT_CLINIC_OR_DEPARTMENT_OTHER): Payer: Self-pay

## 2022-10-29 ENCOUNTER — Encounter: Payer: Self-pay | Admitting: *Deleted

## 2022-10-29 ENCOUNTER — Ambulatory Visit (HOSPITAL_BASED_OUTPATIENT_CLINIC_OR_DEPARTMENT_OTHER)
Admission: RE | Admit: 2022-10-29 | Discharge: 2022-10-29 | Disposition: A | Discharge status: Home / Self Care | Payer: BLUE CROSS/BLUE SHIELD | Source: Ambulatory Visit | Attending: Internal Medicine | Admitting: Internal Medicine

## 2022-10-29 DIAGNOSIS — R011 Cardiac murmur, unspecified: Secondary | ICD-10-CM | POA: Diagnosis not present

## 2022-10-29 DIAGNOSIS — Z8249 Family history of ischemic heart disease and other diseases of the circulatory system: Secondary | ICD-10-CM | POA: Insufficient documentation

## 2022-10-29 LAB — ECHOCARDIOGRAM COMPLETE
Area-P 1/2: 3.91 cm2
P 1/2 time: 1171 msec
S' Lateral: 3.09 cm

## 2022-11-03 DIAGNOSIS — H52202 Unspecified astigmatism, left eye: Secondary | ICD-10-CM | POA: Diagnosis not present

## 2022-11-03 DIAGNOSIS — H2512 Age-related nuclear cataract, left eye: Secondary | ICD-10-CM | POA: Diagnosis not present

## 2022-11-07 ENCOUNTER — Other Ambulatory Visit: Payer: Self-pay | Admitting: *Deleted

## 2022-11-07 DIAGNOSIS — E785 Hyperlipidemia, unspecified: Secondary | ICD-10-CM

## 2022-11-10 ENCOUNTER — Telehealth: Payer: Self-pay | Admitting: Internal Medicine

## 2022-11-10 MED ORDER — ROSUVASTATIN CALCIUM 20 MG PO TABS: 20.0000 mg | ORAL_TABLET | Freq: Every day | ORAL | 3 refills | Status: DC

## 2022-11-10 NOTE — Telephone Encounter (Signed)
*  STAT* If patient is at the pharmacy, call can be transferred to refill team.   1. Which medications need to be refilled? (please list name of each medication and dose if known)  rosuvastatin (CRESTOR) 20 MG tablet  2. Which pharmacy/location (including street and city if local pharmacy) is medication to be sent to? [place] [phone]  3. Do they need a 30 day or 90 day supply?  90 day supply

## 2022-12-22 ENCOUNTER — Encounter (INDEPENDENT_AMBULATORY_CARE_PROVIDER_SITE_OTHER): Payer: Self-pay

## 2022-12-24 ENCOUNTER — Ambulatory Visit: Payer: BLUE CROSS/BLUE SHIELD | Admitting: Internal Medicine

## 2023-01-05 DIAGNOSIS — Z03818 Encounter for observation for suspected exposure to other biological agents ruled out: Secondary | ICD-10-CM | POA: Diagnosis not present

## 2023-01-05 DIAGNOSIS — B349 Viral infection, unspecified: Secondary | ICD-10-CM | POA: Diagnosis not present

## 2023-01-05 DIAGNOSIS — J101 Influenza due to other identified influenza virus with other respiratory manifestations: Secondary | ICD-10-CM | POA: Diagnosis not present

## 2023-01-30 DIAGNOSIS — E785 Hyperlipidemia, unspecified: Secondary | ICD-10-CM | POA: Diagnosis not present

## 2023-01-31 LAB — NMR, LIPOPROFILE
Cholesterol, Total: 147 mg/dL (ref 100–199)
HDL Particle Number: 40.2 umol/L (ref >=30.5)
HDL-C: 56 mg/dL (ref >39)
LDL Particle Number: 1016 nmol/L — ABNORMAL HIGH (ref <1000)
LDL Size: 20.3 nm — ABNORMAL LOW (ref >20.5)
LDL-C (NIH Calc): 75 mg/dL (ref 0–99)
LP-IR Score: 47 — ABNORMAL HIGH (ref <=45)
Small LDL Particle Number: 528 nmol/L — ABNORMAL HIGH (ref <=527)
Triglycerides: 85 mg/dL (ref 0–149)

## 2023-02-02 DIAGNOSIS — D1801 Hemangioma of skin and subcutaneous tissue: Secondary | ICD-10-CM | POA: Diagnosis not present

## 2023-02-02 DIAGNOSIS — Z85828 Personal history of other malignant neoplasm of skin: Secondary | ICD-10-CM | POA: Diagnosis not present

## 2023-02-02 DIAGNOSIS — L918 Other hypertrophic disorders of the skin: Secondary | ICD-10-CM | POA: Diagnosis not present

## 2023-02-02 DIAGNOSIS — L821 Other seborrheic keratosis: Secondary | ICD-10-CM | POA: Diagnosis not present

## 2023-02-02 DIAGNOSIS — L570 Actinic keratosis: Secondary | ICD-10-CM | POA: Diagnosis not present

## 2023-02-02 DIAGNOSIS — D225 Melanocytic nevi of trunk: Secondary | ICD-10-CM | POA: Diagnosis not present

## 2023-02-06 DIAGNOSIS — M545 Low back pain, unspecified: Secondary | ICD-10-CM | POA: Diagnosis not present

## 2023-03-31 DIAGNOSIS — R3912 Poor urinary stream: Secondary | ICD-10-CM | POA: Diagnosis not present

## 2023-03-31 DIAGNOSIS — R350 Frequency of micturition: Secondary | ICD-10-CM | POA: Diagnosis not present

## 2023-03-31 DIAGNOSIS — N401 Enlarged prostate with lower urinary tract symptoms: Secondary | ICD-10-CM | POA: Diagnosis not present

## 2023-03-31 DIAGNOSIS — R3911 Hesitancy of micturition: Secondary | ICD-10-CM | POA: Diagnosis not present

## 2023-03-31 DIAGNOSIS — R3914 Feeling of incomplete bladder emptying: Secondary | ICD-10-CM | POA: Diagnosis not present

## 2023-04-17 DIAGNOSIS — M7062 Trochanteric bursitis, left hip: Secondary | ICD-10-CM | POA: Diagnosis not present

## 2023-04-20 ENCOUNTER — Ambulatory Visit: Payer: PPO | Admitting: Nurse Practitioner

## 2023-04-20 ENCOUNTER — Encounter: Payer: Self-pay | Admitting: Nurse Practitioner

## 2023-04-29 ENCOUNTER — Encounter: Payer: Self-pay | Admitting: Physician Assistant

## 2023-04-29 ENCOUNTER — Ambulatory Visit: Payer: PPO | Attending: Internal Medicine | Admitting: Physician Assistant

## 2023-04-29 VITALS — BP 158/84 | HR 56 | Ht 72.0 in | Wt 217.0 lb

## 2023-04-29 DIAGNOSIS — Z8249 Family history of ischemic heart disease and other diseases of the circulatory system: Secondary | ICD-10-CM

## 2023-04-29 DIAGNOSIS — E785 Hyperlipidemia, unspecified: Secondary | ICD-10-CM | POA: Diagnosis not present

## 2023-04-29 NOTE — Patient Instructions (Signed)
Medication Instructions:  Your physician recommends that you continue on your current medications as directed. Please refer to the Current Medication list given to you today.  *If you need a refill on your cardiac medications before your next appointment, please call your pharmacy*   Lab Work: Your physician recommends that you return for lab work in: 6 months for FASTING Lipid panel  If you have labs (blood work) drawn today and your tests are completely normal, you will receive your results only by: MyChart Message (if you have MyChart) OR A paper copy in the mail If you have any lab test that is abnormal or we need to change your treatment, we will call you to review the results.   Follow-Up: At [facility], you and your health needs are our priority.  As part of our continuing mission to provide you with exceptional heart care, we have created designated Provider Care Teams.  These Care Teams include your primary Cardiologist (physician) and Advanced Practice Providers (APPs -  Physician Assistants and Nurse Practitioners) who all work together to provide you with the care you need, when you need it.  We recommend signing up for the patient portal called "MyChart".  Sign up information is provided on this After Visit Summary.  MyChart is used to connect with patients for Virtual Visits (Telemedicine).  Patients are able to view lab/test results, encounter notes, upcoming appointments, etc.  Non-urgent messages can be sent to your provider as well.   To learn more about what you can do with MyChart, go to [email or web address].    Your next appointment:   6 month(s)  Provider:   Chrystie Nose, MD (Lipid Clinic)

## 2023-04-29 NOTE — Progress Notes (Unsigned)
Cardiology Office Note:    Date:  04/30/2023   ID:  Russell Cruz, DOB August 01, 1946, MRN [id number]  PCP:  Russell Heys, MD   [facility] Providers Cardiologist:  Russell Nose, MD     Referring MD: Russell Heys, MD   Chief Complaint  Patient presents with   Follow-up    Seen for Dr. Rennis Cruz    History of Present Illness:    Russell Cruz is a 76 y.o. male with a hx of hyperlipidemia who is being followed by Dr. Rennis Cruz.  He has a family history of high cholesterol including his father who had an MI and died at age 32.  His son also has high cholesterol as well.  He was last seen by Dr. Rennis Cruz in October 2023 for uncontrolled hyperlipidemia.  Total cholesterol prior to the visit was 233 and LDL was 159.  He had been on rosuvastatin daily, but unfortunately he had side effect associated with muscle aches.  You order to understand his overall cardiac risk, Dr. Rennis Cruz ordered echocardiogram, lipoprotein a, MR profile and coronary calcium score.  Echocardiogram performed on 10/29/2022 showed EF 55 to 60%, grade 1 DD, mildly enlarged RV with normal RVEF, trivial MR, mild AI, mild dilatation of the ascending aorta measuring 40 mm.  Coronary calcium score was 1829 which placed the patient had 87th percentile for age and sex matched contro, there were multiple pulmonary nodules measuring up to 5 mm in diameter, no routine follow-up imaging was recommended per radiologist.  81 mg daily aspirin was added to his medical regimen. Lipoprotein a profile was 40.7 which was negative.  NMR lipid profile showed elevated LDL particle number of 2294, calculated LDL was 175, total cholesterol 250, small LDL particle number 975.  He was restarted on Crestor.  Repeat study in February 2024 showed LDL particle number improved to 1016, small LDL particle #578, LDL has improved to 528.  Patient presents today for follow-up.  He denies any recent exertional chest pain.  He goes to [facility] twice a week to  exercise.  Each exercise regimen last about 1.5 hours.  He has no lower extremity edema on physical exam.  He is lung is clear.  I have reviewed the recent blood work with the patient, he is LDL drop was quite dramatic.  He feels good on the current Zetia and Crestor combination.  I will continue on the current therapy.  He can follow-up with Dr. Rennis Cruz in 38-month, he will need a repeat fasting lipid test prior to the next visit.  Past Medical History:  Diagnosis Date   Hyperlipidemia     History reviewed. No pertinent surgical history.  Current Medications: Current Meds  Medication Sig   aspirin EC 81 MG tablet Take 81 mg by mouth daily. Swallow whole.   CALCIUM CITRATE PO Take by mouth.   co-enzyme Q-10 30 MG capsule Take 30 mg by mouth 3 (three) times daily.   ezetimibe (ZETIA) 10 MG tablet Take 1 tablet (10 mg total) by mouth daily.   glucosamine-chondroitin 500-400 MG tablet Take 1 tablet by mouth 2 (two) times daily.   Misc Natural Products (NF FORMULAS TESTOSTERONE PO) Take by mouth.   Multiple Vitamin (MULTIVITAMIN WITH MINERALS) TABS tablet Take 1 tablet by mouth daily.   Omega-3 Fatty Acids (FISH OIL PO) Take by mouth.   rosuvastatin (CRESTOR) 20 MG tablet Take 1 tablet (20 mg total) by mouth daily.   [DISCONTINUED] Cyanocobalamin (B-12 PO) Take by  mouth.   [DISCONTINUED] meloxicam (MOBIC) 15 MG tablet Take 1 tablet (15 mg total) by mouth daily.     Allergies:   Atorvastatin   Social History   Socioeconomic History   Marital status: Married    Spouse name: Not on file   Number of children: Not on file   Years of education: Not on file   Highest education level: Not on file  Occupational History   Not on file  Tobacco Use   Smoking status: Never   Smokeless tobacco: Not on file  Substance and Sexual Activity   Alcohol use: Yes   Drug use: Not on file   Sexual activity: Not on file  Other Topics Concern   Not on file  Social History Narrative   Not on file    Social Determinants of Health   Financial Resource Strain: Not on file  Food Insecurity: Not on file  Transportation Needs: Not on file  Physical Activity: Not on file  Stress: Not on file  Social Connections: Not on file     Family History: The patient's family history includes Heart attack in his father; Hyperlipidemia in his father.  ROS:   Please see the history of present illness.     All other systems reviewed and are negative.  EKGs/Labs/Other Studies Reviewed:    The following studies were reviewed today:  Echo 10/29/2022  1. Left ventricular ejection fraction, by estimation, is 55 to 60%. The  left ventricle has normal function. The left ventricle has no regional  wall motion abnormalities. Left ventricular diastolic parameters are  consistent with Grade I diastolic  dysfunction (impaired relaxation).   2. Right ventricular systolic function is normal. The right ventricular  size is mildly enlarged. Tricuspid regurgitation signal is inadequate for  assessing PA pressure.   3. The mitral valve is normal in structure. Trivial mitral valve  regurgitation.   4. The aortic valve is tricuspid. There is moderate calcification of the  aortic valve. There is moderate thickening of the aortic valve. Aortic  valve regurgitation is mild. Aortic valve sclerosis/calcification is  present, without any evidence of aortic  stenosis.   5. Aortic dilatation noted. There is mild dilatation of the ascending  aorta, measuring 40 mm.   6. The inferior vena cava is normal in size with greater than 50%  respiratory variability, suggesting right atrial pressure of 3 mmHg.    EKG:  EKG is ordered today.  The ekg ordered today demonstrates normal sinus rhythm, no significant ST-T wave changes.  Recent Labs: No results found for requested labs within last 365 days.  Recent Lipid Panel No results found for: "CHOL", "TRIG", "HDL", "CHOLHDL", "VLDL", "LDLCALC", "LDLDIRECT"   Risk  Assessment/Calculations:           Physical Exam:    VS:  BP (!) 158/84   Pulse (!) 56   Ht 6' (1.829 m)   Wt 217 lb (98.4 kg)   SpO2 98%   BMI 29.43 kg/m        Wt Readings from Last 3 Encounters:  04/29/23 217 lb (98.4 kg)  09/15/22 220 lb 3.2 oz (99.9 kg)  05/02/14 225 lb (102.1 kg)     GEN:  Well nourished, well developed in no acute distress HEENT: Normal NECK: No JVD; No carotid bruits LYMPHATICS: No lymphadenopathy CARDIAC: RRR, no murmurs, rubs, gallops RESPIRATORY:  Clear to auscultation without rales, wheezing or rhonchi  ABDOMEN: Soft, non-tender, non-distended MUSCULOSKELETAL:  No edema; No deformity  SKIN: Warm and dry NEUROLOGIC:  Alert and oriented x 3 PSYCHIATRIC:  Normal affect   ASSESSMENT:    1. Hyperlipidemia, unspecified hyperlipidemia type   2. Family history of early CAD    PLAN:    In order of problems listed above:  Hyperlipidemia: Patient had a significant improvement after addition of Zetia and rosuvastatin.  Will continue on the current therapy and repeat lipid profile prior to the next office visit  Family history of early CAD: Denies any recent chest pain.           Medication Adjustments/Labs and Tests Ordered: Current medicines are reviewed at length with the patient today.  Concerns regarding medicines are outlined above.  Orders Placed This Encounter  Procedures   NMR, lipoprofile   EKG 12-Lead   No orders of the defined types were placed in this encounter.   Patient Instructions  Medication Instructions:  Your physician recommends that you continue on your current medications as directed. Please refer to the Current Medication list given to you today.  *If you need a refill on your cardiac medications before your next appointment, please call your pharmacy*   Lab Work: Your physician recommends that you return for lab work in: 6 months for FASTING Lipid panel  If you have labs (blood work) drawn today and your  tests are completely normal, you will receive your results only by: MyChart Message (if you have MyChart) OR A paper copy in the mail If you have any lab test that is abnormal or we need to change your treatment, we will call you to review the results.   Follow-Up: At [facility], you and your health needs are our priority.  As part of our continuing mission to provide you with exceptional heart care, we have created designated Provider Care Teams.  These Care Teams include your primary Cardiologist (physician) and Advanced Practice Providers (APPs -  Physician Assistants and Nurse Practitioners) who all work together to provide you with the care you need, when you need it.  We recommend signing up for the patient portal called "MyChart".  Sign up information is provided on this After Visit Summary.  MyChart is used to connect with patients for Virtual Visits (Telemedicine).  Patients are able to view lab/test results, encounter notes, upcoming appointments, etc.  Non-urgent messages can be sent to your provider as well.   To learn more about what you can do with MyChart, go to [email or web address].    Your next appointment:   6 month(s)  Provider:   Chrystie Nose, MD (Lipid Clinic)   Signed, Azalee Course, [place]  04/30/2023 10:23 PM    [facility]

## 2023-04-30 ENCOUNTER — Encounter: Payer: Self-pay | Admitting: Physician Assistant

## 2023-05-01 DIAGNOSIS — M7062 Trochanteric bursitis, left hip: Secondary | ICD-10-CM | POA: Diagnosis not present

## 2023-05-11 DIAGNOSIS — R3911 Hesitancy of micturition: Secondary | ICD-10-CM | POA: Diagnosis not present

## 2023-05-11 DIAGNOSIS — R3912 Poor urinary stream: Secondary | ICD-10-CM | POA: Diagnosis not present

## 2023-05-11 DIAGNOSIS — R350 Frequency of micturition: Secondary | ICD-10-CM | POA: Diagnosis not present

## 2023-05-13 DIAGNOSIS — M7062 Trochanteric bursitis, left hip: Secondary | ICD-10-CM | POA: Diagnosis not present

## 2023-05-20 DIAGNOSIS — M7062 Trochanteric bursitis, left hip: Secondary | ICD-10-CM | POA: Diagnosis not present

## 2023-05-27 DIAGNOSIS — M7062 Trochanteric bursitis, left hip: Secondary | ICD-10-CM | POA: Diagnosis not present

## 2023-06-03 DIAGNOSIS — M7062 Trochanteric bursitis, left hip: Secondary | ICD-10-CM | POA: Diagnosis not present

## 2023-06-15 DIAGNOSIS — M7062 Trochanteric bursitis, left hip: Secondary | ICD-10-CM | POA: Diagnosis not present

## 2023-06-29 DIAGNOSIS — M7062 Trochanteric bursitis, left hip: Secondary | ICD-10-CM | POA: Diagnosis not present

## 2023-07-22 DIAGNOSIS — M5416 Radiculopathy, lumbar region: Secondary | ICD-10-CM | POA: Diagnosis not present

## 2023-07-30 DIAGNOSIS — M4696 Unspecified inflammatory spondylopathy, lumbar region: Secondary | ICD-10-CM | POA: Diagnosis not present

## 2023-08-03 DIAGNOSIS — M4696 Unspecified inflammatory spondylopathy, lumbar region: Secondary | ICD-10-CM | POA: Diagnosis not present

## 2023-08-19 DIAGNOSIS — M7062 Trochanteric bursitis, left hip: Secondary | ICD-10-CM | POA: Diagnosis not present

## 2023-08-25 DIAGNOSIS — M25552 Pain in left hip: Secondary | ICD-10-CM | POA: Diagnosis not present

## 2023-09-11 DIAGNOSIS — M25552 Pain in left hip: Secondary | ICD-10-CM | POA: Diagnosis not present

## 2023-09-30 DIAGNOSIS — M25561 Pain in right knee: Secondary | ICD-10-CM | POA: Diagnosis not present

## 2023-10-12 ENCOUNTER — Ambulatory Visit: Payer: PPO | Admitting: Internal Medicine

## 2023-11-11 DIAGNOSIS — Z0000 Encounter for general adult medical examination without abnormal findings: Secondary | ICD-10-CM | POA: Diagnosis not present

## 2023-11-11 DIAGNOSIS — E785 Hyperlipidemia, unspecified: Secondary | ICD-10-CM | POA: Diagnosis not present

## 2023-11-11 DIAGNOSIS — E7800 Pure hypercholesterolemia, unspecified: Secondary | ICD-10-CM | POA: Diagnosis not present

## 2023-11-11 DIAGNOSIS — Z1159 Encounter for screening for other viral diseases: Secondary | ICD-10-CM | POA: Diagnosis not present

## 2023-11-11 DIAGNOSIS — N529 Male erectile dysfunction, unspecified: Secondary | ICD-10-CM | POA: Diagnosis not present

## 2023-11-11 DIAGNOSIS — N400 Enlarged prostate without lower urinary tract symptoms: Secondary | ICD-10-CM | POA: Diagnosis not present

## 2023-11-11 DIAGNOSIS — R7303 Prediabetes: Secondary | ICD-10-CM | POA: Diagnosis not present

## 2023-11-11 DIAGNOSIS — I77810 Thoracic aortic ectasia: Secondary | ICD-10-CM | POA: Diagnosis not present

## 2023-11-11 DIAGNOSIS — Z85828 Personal history of other malignant neoplasm of skin: Secondary | ICD-10-CM | POA: Diagnosis not present

## 2023-11-12 ENCOUNTER — Other Ambulatory Visit: Payer: Self-pay | Admitting: Internal Medicine

## 2024-02-07 ENCOUNTER — Encounter (HOSPITAL_COMMUNITY): Payer: Self-pay | Admitting: Emergency Medicine

## 2024-02-07 ENCOUNTER — Ambulatory Visit (HOSPITAL_COMMUNITY)
Admission: EM | Admit: 2024-02-07 | Discharge: 2024-02-07 | Disposition: A | Discharge status: Home / Self Care | Attending: Emergency Medicine | Admitting: Emergency Medicine

## 2024-02-07 DIAGNOSIS — S61412A Laceration without foreign body of left hand, initial encounter: Secondary | ICD-10-CM

## 2024-02-07 MED ORDER — POVIDONE-IODINE 10 % EX SOLN
CUTANEOUS | Status: AC
  Filled 2024-02-07: qty 118

## 2024-02-07 MED ORDER — LIDOCAINE-EPINEPHRINE 1 %-1:100000 IJ SOLN
INTRAMUSCULAR | Status: AC
  Filled 2024-02-07: qty 1

## 2024-02-07 NOTE — ED Triage Notes (Addendum)
 Pt reports was using pocket knife to cut straps on box of package was opening to put together. Wife states they tried to use some clot swab and believes some got into the cut.   Pt reports that had a tetanus shot December 2024 at PCP office.

## 2024-02-07 NOTE — Discharge Instructions (Signed)
 Return here in 10-14 days for suture removal.  Keep the area dry for the first 24 hours. After that keep the area clean and dry. Apply Neosporin to the area.  If you develop pus drainage, redness, swelling, or warmth. Return here sooner for re-evaluation.

## 2024-02-07 NOTE — ED Provider Notes (Signed)
 [facility]    CSN: [id number] Arrival date & time: 02/07/24  1449      History   Chief Complaint Chief Complaint  Patient presents with   Laceration    HPI Russell Cruz is a 78 y.o. male.   Patient presents with laceration to left hand. Patient states he was trying to cut some straps on a box and his pocket knife slipped and cut the back of his left hand. Patient reports using Quikclot and applied Steri-strips prior to arrival. Wife is concerned part of the [place] [employer] is in the wound. Reports he received a tetanus shot in December 2024.    Laceration   Past Medical History:  Diagnosis Date   Hyperlipidemia     There are no active problems to display for this patient.   History reviewed. No pertinent surgical history.     Home Medications    Prior to Admission medications   Medication Sig Start Date End Date Taking? Authorizing Provider  CALCIUM CITRATE PO Take by mouth.    [provider]  co-enzyme Q-10 30 MG capsule Take 30 mg by mouth 3 (three) times daily.    [provider]  ezetimibe (ZETIA) 10 MG tablet Take 1 tablet (10 mg total) by mouth daily. 10/08/22 10/03/23  Chrystie Nose, MD  glucosamine-chondroitin 500-400 MG tablet Take 1 tablet by mouth 2 (two) times daily.    [provider]  Misc Natural Products (NF FORMULAS TESTOSTERONE PO) Take by mouth.    [provider]  Multiple Vitamin (MULTIVITAMIN WITH MINERALS) TABS tablet Take 1 tablet by mouth daily.    [provider]  Omega-3 Fatty Acids (FISH OIL PO) Take by mouth.    [provider]  rosuvastatin (CRESTOR) 20 MG tablet TAKE 1 TABLET BY MOUTH DAILY 11/16/23   Hilty, Lisette Abu, MD    Family History Family History  Problem Relation Age of Onset   Hyperlipidemia Father    Heart attack Father     Social History Social History   Tobacco Use   Smoking status: Never  Substance Use Topics   Alcohol use: Yes     Allergies    Atorvastatin   Review of Systems Review of Systems  Per HPI  Physical Exam Triage Vital Signs ED Triage Vitals  Encounter Vitals Group     BP 02/07/24 1529 117/69     Systolic BP Percentile --      Diastolic BP Percentile --      Pulse Rate 02/07/24 1529 71     Resp 02/07/24 1529 18     Temp 02/07/24 1529 99 F (37.2 C)     Temp Source 02/07/24 1529 Oral     SpO2 02/07/24 1529 93 %     Weight --      Height --      Head Circumference --      Peak Flow --      Pain Score 02/07/24 1528 4     Pain Loc --      Pain Education --      Exclude from Growth Chart --    No data found.  Updated Vital Signs BP 117/69 (BP Location: Right Arm)   Pulse 71   Temp 99 F (37.2 C) (Oral)   Resp 18   SpO2 93%   Visual Acuity Right Eye Distance:   Left Eye Distance:   Bilateral Distance:    Right Eye Near:   Left Eye Near:  Bilateral Near:     Physical Exam Vitals and nursing note reviewed.  Constitutional:      General: He is not in acute distress.    Appearance: Normal appearance. He is not ill-appearing.  Skin:    Findings: Laceration present.     Comments: 3 cm laceration noted to  the left lateral dorsal aspect of hand. Bleeding controlled.   Neurological:     Mental Status: He is alert.      UC Treatments / Results  Labs (all labs ordered are listed, but only abnormal results are displayed) Labs Reviewed - No data to display  EKG   Radiology No results found.  Procedures Laceration Repair  Date/Time: 02/07/2024 5:03 PM  Performed by: Letta Kocher, NP Authorized by: Letta Kocher, NP   Consent:    Consent obtained:  Verbal   Consent given by:  Patient   Risks discussed:  Infection, need for additional repair, pain, poor cosmetic result and poor wound healing   Alternatives discussed:  No treatment and delayed treatment Universal protocol:    Procedure explained and questions answered to patient or proxy's satisfaction: yes      Relevant documents present and verified: yes     Immediately prior to procedure, a time out was called: yes     Patient identity confirmed:  Verbally with patient Anesthesia:    Anesthesia method:  Local infiltration   Local anesthetic:  Lidocaine 1% WITH epi Laceration details:    Location:  Hand   Hand location:  L hand, dorsum   Length (cm):  3   Depth (mm):  3 Pre-procedure details:    Preparation:  Patient was prepped and draped in usual sterile fashion Exploration:    Wound exploration: entire depth of wound visualized     Wound extent: no foreign bodies/material noted     Contaminated: no   Treatment:    Area cleansed with:  Povidone-iodine and saline   Amount of cleaning:  Standard   Irrigation solution:  Sterile saline   Irrigation volume:  10 mL   Irrigation method:  Syringe   Debridement:  None   Undermining:  None Skin repair:    Repair method:  Sutures   Suture size:  4-0   Suture material:  Prolene   Suture technique:  Simple interrupted   Number of sutures:  3 Approximation:    Approximation:  Close Repair type:    Repair type:  Simple Post-procedure details:    Dressing:  Non-adherent dressing   Procedure completion:  Tolerated well, no immediate complications  (including critical care time)  Medications Ordered in UC Medications - No data to display  Initial Impression / Assessment and Plan / UC Course  I have reviewed the triage vital signs and the nursing notes.  Pertinent labs & imaging results that were available during my care of the patient were reviewed by me and considered in my medical decision making (see chart for details).     3 cm laceration noted to the lateral dorsal aspect of the left hand.  Laceration repair completed per procedure documentation above. Discussed proper wound care. Discussed return precautions  Final Clinical Impressions(s) / UC Diagnoses   Final diagnoses:  Laceration of left hand, foreign body presence  unspecified, initial encounter     Discharge Instructions      Return here in 10-14 days for suture removal.  Keep the area dry for the first 24 hours. After that keep the area clean  and dry. Apply Neosporin to the area.  If you develop pus drainage, redness, swelling, or warmth. Return here sooner for re-evaluation.     ED Prescriptions   None    PDMP not reviewed this encounter.   Wynonia Lawman A, NP 02/07/24 [phone]

## 2024-02-17 ENCOUNTER — Other Ambulatory Visit: Payer: Self-pay | Admitting: Internal Medicine

## 2024-05-18 ENCOUNTER — Other Ambulatory Visit: Payer: Self-pay | Admitting: Internal Medicine

## 2024-10-23 ENCOUNTER — Other Ambulatory Visit: Payer: Self-pay | Admitting: Internal Medicine

## 2024-11-07 ENCOUNTER — Other Ambulatory Visit: Payer: Self-pay | Admitting: Internal Medicine

## 2024-12-22 ENCOUNTER — Other Ambulatory Visit: Payer: Self-pay | Admitting: Internal Medicine
